# Patient Record
Sex: Female | Born: 1960 | Race: White | Hispanic: No | Marital: Married | Smoking: Never smoker
Health system: Southern US, Community
[De-identification: ages and names within clinical notes are randomized; demographics above are authoritative.]

## PROBLEM LIST (undated history)

## (undated) DIAGNOSIS — R112 Nausea with vomiting, unspecified: Secondary | ICD-10-CM

## (undated) DIAGNOSIS — Z9889 Other specified postprocedural states: Secondary | ICD-10-CM

## (undated) DIAGNOSIS — T7840XA Allergy, unspecified, initial encounter: Secondary | ICD-10-CM

## (undated) DIAGNOSIS — K219 Gastro-esophageal reflux disease without esophagitis: Secondary | ICD-10-CM

## (undated) HISTORY — DX: Allergy, unspecified, initial encounter: T78.40XA

## (undated) HISTORY — DX: Nausea with vomiting, unspecified: R11.2

## (undated) HISTORY — DX: Gastro-esophageal reflux disease without esophagitis: K21.9

## (undated) HISTORY — DX: Other specified postprocedural states: Z98.890

---

## 1997-06-25 ENCOUNTER — Other Ambulatory Visit: Admission: RE | Admit: 1997-06-25 | Discharge: 1997-06-25 | Payer: Self-pay | Admitting: Obstetrics and Gynecology

## 1997-08-08 ENCOUNTER — Inpatient Hospital Stay (HOSPITAL_COMMUNITY): Admission: AD | Admit: 1997-08-08 | Discharge: 1997-08-08 | Payer: Self-pay | Admitting: Obstetrics & Gynecology

## 1998-01-03 ENCOUNTER — Inpatient Hospital Stay (HOSPITAL_COMMUNITY): Admission: AD | Admit: 1998-01-03 | Discharge: 1998-01-03 | Payer: Self-pay | Admitting: Obstetrics and Gynecology

## 1998-02-13 ENCOUNTER — Inpatient Hospital Stay (HOSPITAL_COMMUNITY): Admission: AD | Admit: 1998-02-13 | Discharge: 1998-02-13 | Payer: Self-pay | Admitting: Obstetrics and Gynecology

## 1998-02-13 ENCOUNTER — Inpatient Hospital Stay (HOSPITAL_COMMUNITY): Admission: AD | Admit: 1998-02-13 | Discharge: 1998-02-16 | Payer: Self-pay | Admitting: Obstetrics and Gynecology

## 1998-05-22 ENCOUNTER — Other Ambulatory Visit: Admission: RE | Admit: 1998-05-22 | Discharge: 1998-05-22 | Payer: Self-pay | Admitting: Obstetrics and Gynecology

## 1999-01-22 ENCOUNTER — Ambulatory Visit (HOSPITAL_COMMUNITY): Admission: RE | Admit: 1999-01-22 | Discharge: 1999-01-22 | Payer: Self-pay | Admitting: Obstetrics and Gynecology

## 2002-04-30 ENCOUNTER — Other Ambulatory Visit: Admission: RE | Admit: 2002-04-30 | Discharge: 2002-04-30 | Payer: Self-pay | Admitting: Obstetrics and Gynecology

## 2004-04-03 ENCOUNTER — Ambulatory Visit: Payer: Self-pay | Admitting: Gastroenterology

## 2004-04-07 ENCOUNTER — Ambulatory Visit: Payer: Self-pay | Admitting: Gastroenterology

## 2004-09-09 ENCOUNTER — Ambulatory Visit: Payer: Self-pay | Admitting: Internal Medicine

## 2004-09-22 ENCOUNTER — Ambulatory Visit: Payer: Self-pay | Admitting: Internal Medicine

## 2004-10-07 ENCOUNTER — Ambulatory Visit: Payer: Self-pay | Admitting: Gastroenterology

## 2004-10-13 ENCOUNTER — Ambulatory Visit (HOSPITAL_COMMUNITY): Admission: RE | Admit: 2004-10-13 | Discharge: 2004-10-13 | Payer: Self-pay | Admitting: Gastroenterology

## 2004-10-22 ENCOUNTER — Ambulatory Visit (HOSPITAL_COMMUNITY): Admission: RE | Admit: 2004-10-22 | Discharge: 2004-10-22 | Payer: Self-pay | Admitting: Gastroenterology

## 2004-10-29 ENCOUNTER — Ambulatory Visit (HOSPITAL_COMMUNITY): Admission: RE | Admit: 2004-10-29 | Discharge: 2004-10-29 | Payer: Self-pay | Admitting: Gastroenterology

## 2004-10-29 ENCOUNTER — Ambulatory Visit: Payer: Self-pay | Admitting: Gastroenterology

## 2004-12-09 ENCOUNTER — Ambulatory Visit: Payer: Self-pay | Admitting: Gastroenterology

## 2004-12-22 ENCOUNTER — Ambulatory Visit (HOSPITAL_COMMUNITY): Admission: RE | Admit: 2004-12-22 | Discharge: 2004-12-22 | Payer: Self-pay | Admitting: Gastroenterology

## 2005-01-20 ENCOUNTER — Ambulatory Visit (HOSPITAL_COMMUNITY): Admission: RE | Admit: 2005-01-20 | Discharge: 2005-01-20 | Payer: Self-pay | Admitting: Surgery

## 2005-03-03 ENCOUNTER — Ambulatory Visit: Payer: Self-pay | Admitting: Gastroenterology

## 2009-04-02 ENCOUNTER — Ambulatory Visit: Payer: Self-pay | Admitting: Family Medicine

## 2009-04-11 ENCOUNTER — Ambulatory Visit: Payer: Self-pay | Admitting: Internal Medicine

## 2011-07-01 ENCOUNTER — Other Ambulatory Visit: Payer: Self-pay | Admitting: Obstetrics and Gynecology

## 2012-07-05 ENCOUNTER — Other Ambulatory Visit: Payer: Self-pay | Admitting: Obstetrics and Gynecology

## 2013-07-30 ENCOUNTER — Other Ambulatory Visit: Payer: Self-pay | Admitting: Obstetrics and Gynecology

## 2013-07-31 LAB — CYTOLOGY - PAP

## 2014-09-18 ENCOUNTER — Encounter: Payer: Self-pay | Admitting: Gastroenterology

## 2014-10-01 ENCOUNTER — Other Ambulatory Visit: Payer: Self-pay | Admitting: Obstetrics and Gynecology

## 2014-10-02 LAB — CYTOLOGY - PAP

## 2017-02-28 ENCOUNTER — Encounter: Payer: Self-pay | Admitting: Podiatry

## 2017-02-28 ENCOUNTER — Ambulatory Visit (INDEPENDENT_AMBULATORY_CARE_PROVIDER_SITE_OTHER): Payer: Managed Care, Other (non HMO) | Admitting: Podiatry

## 2017-02-28 DIAGNOSIS — L03031 Cellulitis of right toe: Secondary | ICD-10-CM | POA: Diagnosis not present

## 2017-02-28 DIAGNOSIS — L02611 Cutaneous abscess of right foot: Secondary | ICD-10-CM | POA: Diagnosis not present

## 2017-02-28 MED ORDER — GENTAMICIN SULFATE 0.1 % EX CREA: 1.0000 "application " | TOPICAL_CREAM | Freq: Three times a day (TID) | CUTANEOUS | 1 refills | Status: DC

## 2017-02-28 MED ORDER — DOXYCYCLINE HYCLATE 100 MG PO TABS: 100.0000 mg | ORAL_TABLET | Freq: Two times a day (BID) | ORAL | 0 refills | Status: DC

## 2017-03-03 LAB — WOUND CULTURE
MICRO NUMBER:: 90085513
RESULT:: NO GROWTH
SPECIMEN QUALITY:: ADEQUATE

## 2017-03-08 NOTE — Progress Notes (Signed)
   HPI: 57 year old female presents to the office today as a new patient regarding discoloration to her toenails.  She also states that her right third toe is red and swollen.  [place] was wearing sandals and she noticed some residual swelling and pain to the right third toe.  This is been approximately 2-3 weeks now.  She presents today for further treatment and evaluation  History reviewed. No pertinent past medical history.   Physical Exam: General: The patient is alert and oriented x3 in no acute distress.  Dermatology: Skin is warm, dry and supple bilateral lower extremities.  Superficial abscess lesion noted to the distal tuft of the third toe right foot.  Upon debridement there was some purulent drainage.  There is no exposed bone muscle tendon ligament or joint.  No malodor noted.  Post debridement measurements were approximately 0.6 x 0.6 x 0.1 cm.  There is some mild dystrophy noted to the nails 1-5 bilateral.  Nails are not necessarily thickened however there is some discoloration noted.  Likely not fungal related.  Vascular: Palpable pedal pulses bilaterally. No edema or erythema noted. Capillary refill within normal limits.  Neurological: Epicritic and protective threshold grossly intact bilaterally.   Musculoskeletal Exam: Range of motion within normal limits to all pedal and ankle joints bilateral. Muscle strength 5/5 in all groups bilateral.    Assessment: -Abscess third digit right foot distal tuft -Discoloration with nail dystrophy 1-5 bilateral   Plan of Care:  -Patient was evaluated today. -Incision and drainage with excisional debridement of the distal toe abscess was performed using a tissue nipper.  All purulent drainage was expressed and superficial necrotic tissue was excisionally debrided including subcutaneous tissue. -Wound cultures were taken and sent to pathology for culture and sensitivity -Prescription for doxycycline  100 mg -Prescription for gentamicin cream to be applied daily with a Band-Aid -Return to clinic in 2 weeks    Felecia ShellingBrent M. Brydan Downard, DPM [facility]  Dr. Felecia ShellingBrent M. Kaytee Taliercio, DPM    [address]                                        [place], [place] [address]                Office [phone]  Fax [phone]

## 2017-03-14 ENCOUNTER — Ambulatory Visit (INDEPENDENT_AMBULATORY_CARE_PROVIDER_SITE_OTHER): Payer: Managed Care, Other (non HMO) | Admitting: Podiatry

## 2017-03-14 DIAGNOSIS — L02611 Cutaneous abscess of right foot: Secondary | ICD-10-CM | POA: Diagnosis not present

## 2017-03-14 DIAGNOSIS — L03031 Cellulitis of right toe: Secondary | ICD-10-CM | POA: Diagnosis not present

## 2017-03-16 NOTE — Progress Notes (Signed)
   HPI: 57 year old female presenting today for follow up evaluation of an abscess present on the third toe of the right foot. She states the area is improving. She has been applying gentamicin cream as directed and has completed a course of Doxycycline. There are no modifying factors noted. Patient is here for further evaluation and treatment.   No past medical history on file.   Physical Exam: General: The patient is alert and oriented x3 in no acute distress.  Dermatology: Soft tissue lesion noted to distal tuft of the 3rd toe of the right foot. Negative for drainage. Skin is warm, dry and supple bilateral lower extremities.  Vascular: Palpable pedal pulses bilaterally. No edema or erythema noted. Capillary refill within normal limits.  Neurological: Epicritic and protective threshold grossly intact bilaterally.   Musculoskeletal Exam: Range of motion within normal limits to all pedal and ankle joints bilateral. Muscle strength 5/5 in all groups bilateral.   Assessment: - right 3rd toe abscess - improved   Plan of Care:  - Patient evaluated. - Light debridement performed with tissue nipper. Dry sterile dressing applied. - Continue using gentamicin cream as directed for one week. - Return to clinic in 4 weeks. If not better, we may consider cantharone.    Felecia ShellingBrent M. Katerine Morua, DPM [facility]  Dr. Felecia ShellingBrent M. Manette Doto, DPM    [address]                                        [place], [place] [address]                Office [phone]  Fax [phone]

## 2017-04-11 ENCOUNTER — Encounter: Payer: Managed Care, Other (non HMO) | Admitting: Podiatry

## 2017-04-18 NOTE — Progress Notes (Signed)
This encounter was created in error - please disregard.

## 2018-10-23 ENCOUNTER — Other Ambulatory Visit: Payer: Self-pay | Admitting: Obstetrics and Gynecology

## 2018-10-23 DIAGNOSIS — Z803 Family history of malignant neoplasm of breast: Secondary | ICD-10-CM

## 2018-11-15 ENCOUNTER — Ambulatory Visit
Admission: RE | Admit: 2018-11-15 | Discharge: 2018-11-15 | Disposition: A | Discharge status: Home / Self Care | Payer: Managed Care, Other (non HMO) | Source: Ambulatory Visit | Attending: Obstetrics and Gynecology | Admitting: Obstetrics and Gynecology

## 2018-11-15 DIAGNOSIS — Z803 Family history of malignant neoplasm of breast: Secondary | ICD-10-CM

## 2018-11-15 MED ORDER — GADOBUTROL 1 MMOL/ML IV SOLN
9.0000 mL | Freq: Once | INTRAVENOUS | Status: AC | PRN
  Administered 2018-11-15: 9 mL via INTRAVENOUS

## 2019-01-10 ENCOUNTER — Encounter: Payer: Self-pay | Admitting: Internal Medicine

## 2019-02-06 ENCOUNTER — Ambulatory Visit (AMBULATORY_SURGERY_CENTER): Payer: Managed Care, Other (non HMO) | Admitting: *Deleted

## 2019-02-06 ENCOUNTER — Other Ambulatory Visit: Payer: Self-pay

## 2019-02-06 VITALS — Temp 96.2°F | Ht 63.0 in | Wt 194.2 lb

## 2019-02-06 DIAGNOSIS — Z1211 Encounter for screening for malignant neoplasm of colon: Secondary | ICD-10-CM

## 2019-02-06 DIAGNOSIS — Z1159 Encounter for screening for other viral diseases: Secondary | ICD-10-CM

## 2019-02-06 NOTE — Progress Notes (Signed)
Pt is aware that care partner will wait in the car during procedure; if they feel like they will be too hot or cold to wait in the car; they may wait in the 4 th floor lobby. Patient is aware to bring only one care partner. We want them to wear a mask (we do not have any that we can provide them), practice social distancing, and we will check their temperatures when they get here.  I did remind the patient that their care partner needs to stay in the parking lot the entire time and have a cell phone available, we will call them when the pt is ready for discharge. Patient will wear mask into building.  covid test 02-15-19 at 820.  No egg or soy allergy  No home oxygen use or problems with anesthesia  No medications for weight loss taken  emmi information given

## 2019-02-13 ENCOUNTER — Encounter: Payer: Self-pay | Admitting: Internal Medicine

## 2019-02-15 ENCOUNTER — Ambulatory Visit (INDEPENDENT_AMBULATORY_CARE_PROVIDER_SITE_OTHER): Payer: Managed Care, Other (non HMO)

## 2019-02-15 ENCOUNTER — Other Ambulatory Visit: Payer: Self-pay | Admitting: Internal Medicine

## 2019-02-15 DIAGNOSIS — Z1159 Encounter for screening for other viral diseases: Secondary | ICD-10-CM

## 2019-02-16 LAB — SARS CORONAVIRUS 2 (TAT 6-24 HRS): SARS Coronavirus 2: NEGATIVE

## 2019-02-20 ENCOUNTER — Encounter: Payer: Self-pay | Admitting: Internal Medicine

## 2019-02-20 ENCOUNTER — Other Ambulatory Visit: Payer: Self-pay

## 2019-02-20 ENCOUNTER — Ambulatory Visit (AMBULATORY_SURGERY_CENTER): Payer: Managed Care, Other (non HMO) | Admitting: Internal Medicine

## 2019-02-20 VITALS — BP 176/71 | HR 57 | Temp 98.1°F | Resp 16 | Ht 63.0 in | Wt 194.2 lb

## 2019-02-20 DIAGNOSIS — Z1211 Encounter for screening for malignant neoplasm of colon: Secondary | ICD-10-CM | POA: Diagnosis present

## 2019-02-20 MED ORDER — SODIUM CHLORIDE 0.9 % IV SOLN: 500.0000 mL | Freq: Once | INTRAVENOUS | Status: DC

## 2019-02-20 NOTE — Op Note (Signed)
[facility] Patient Name: Jasmine Huffman Procedure Date: 02/20/2019 9:17 AM MRN: [id number] Endoscopist: Iva Boop , MD Age: 59 Referring MD:  Date of Birth: [birth year] Gender: Female Account #: [id number] Procedure:                Colonoscopy Indications:              Screening for colorectal malignant neoplasm Medicines:                Propofol per Anesthesia, Monitored Anesthesia Care Procedure:                Pre-Anesthesia Assessment:                           - Prior to the procedure, a History and Physical                            was performed, and patient medications and                            allergies were reviewed. The patient's tolerance of                            previous anesthesia was also reviewed. The risks                            and benefits of the procedure and the sedation                            options and risks were discussed with the patient.                            All questions were answered, and informed consent                            was obtained. Prior Anticoagulants: The patient has                            taken no previous anticoagulant or antiplatelet                            agents. ASA Grade Assessment: II - A patient with                            mild systemic disease. After reviewing the risks                            and benefits, the patient was deemed in                            satisfactory condition to undergo the procedure.                           After obtaining informed consent, the colonoscope  was passed under direct vision. Throughout the                            procedure, the patient's blood pressure, pulse, and                            oxygen saturations were monitored continuously. The                            Colonoscope was introduced through the anus and                            advanced to the the cecum, identified by   appendiceal orifice and ileocecal valve. The                            ileocecal valve, appendiceal orifice, and rectum                            were photographed. The quality of the bowel                            preparation was good. The bowel preparation used                            was Miralax via split dose instruction. Scope In: 9:29:04 AM Scope Out: 9:40:35 AM Scope Withdrawal Time: 0 hours 9 minutes 54 seconds  Total Procedure Duration: 0 hours 11 minutes 31 seconds  Findings:                 Skin tags were found on perianal exam.                           The digital rectal exam was normal.                           Multiple small-mouthed diverticula were found in                            the sigmoid colon.                           The exam was otherwise without abnormality on                            direct and retroflexion views. Complications:            No immediate complications. Estimated blood loss:                            None. Estimated Blood Loss:     Estimated blood loss: none. Impression:               - Perianal skin tags found on perianal exam.                           -  Diverticulosis in the sigmoid colon.                           - The examination was otherwise normal on direct                            and retroflexion views.                           - No specimens collected. Recommendation:           - Repeat colonoscopy/other appropriate test in 10                            years for screening purposes. Does not need annual                            hemoccults in the interim.                           - Resume previous diet.                           - Continue present medications. Gatha Mayer, MD 02/20/2019 9:50:23 AM This report has been signed electronically.

## 2019-02-20 NOTE — Patient Instructions (Addendum)
No polyps or cancer seen, clean prep.  You do have diverticulosis - thickened muscle rings and pouches in the colon wall. Please read the handout about this condition.  Next routine colonoscopy or other screening test in 10 years - 2031  Begin taking Prilosec daily.  Call to make appointment to discuss GERD symptoms.  GERD handout provided.  I appreciate the opportunity to care for you. Iva Boop, MD, FACG   YOU HAD AN ENDOSCOPIC PROCEDURE TODAY AT THE [facility]:   Refer to the procedure report that was given to you for any specific questions about what was found during the examination.  If the procedure report does not answer your questions, please call your gastroenterologist to clarify.  If you requested that your care partner not be given the details of your procedure findings, then the procedure report has been included in a sealed envelope for you to review at your convenience later.  YOU SHOULD EXPECT: Some feelings of bloating in the abdomen. Passage of more gas than usual.  Walking can help get rid of the air that was put into your GI tract during the procedure and reduce the bloating. If you had a lower endoscopy (such as a colonoscopy or flexible sigmoidoscopy) you may notice spotting of blood in your stool or on the toilet paper. If you underwent a bowel prep for your procedure, you may not have a normal bowel movement for a few days.  Please Note:  You might notice some irritation and congestion in your nose or some drainage.  This is from the oxygen used during your procedure.  There is no need for concern and it should clear up in a day or so.  SYMPTOMS TO REPORT IMMEDIATELY:   Following lower endoscopy (colonoscopy or flexible sigmoidoscopy):  Excessive amounts of blood in the stool  Significant tenderness or worsening of abdominal pains  Swelling of the abdomen that is new, acute  Fever of 100F or higher   For urgent or emergent issues, a  gastroenterologist can be reached at any hour by calling [phone].   DIET:  We do recommend a small meal at first, but then you may proceed to your regular diet.  Drink plenty of fluids but you should avoid alcoholic beverages for 24 hours.  ACTIVITY:  You should plan to take it easy for the rest of today and you should NOT DRIVE or use heavy machinery until tomorrow (because of the sedation medicines used during the test).    FOLLOW UP: Our staff will call the number listed on your records 48-72 hours following your procedure to check on you and address any questions or concerns that you may have regarding the information given to you following your procedure. If we do not reach you, we will leave a message.  We will attempt to reach you two times.  During this call, we will ask if you have developed any symptoms of COVID 19. If you develop any symptoms (ie: fever, flu-like symptoms, shortness of breath, cough etc.) before then, please call [phone].  If you test positive for Covid 19 in the 2 weeks post procedure, please call and report this information to [place].    If any biopsies were taken you will be contacted by phone or by letter within the next 1-3 weeks.  Please call us at [phone] if you have not heard about the biopsies in 3 weeks.    SIGNATURES/CONFIDENTIALITY: You and/or your care partner have signed  paperwork which will be entered into your electronic medical record.  These signatures attest to the fact that that the information above on your After Visit Summary has been reviewed and is understood.  Full responsibility of the confidentiality of this discharge information lies with you and/or your care-partner.

## 2019-02-20 NOTE — Progress Notes (Signed)
JB - Temp  DT - VS   

## 2019-02-20 NOTE — Progress Notes (Signed)
Report to PACU, RN, vss, BBS= Clear.  

## 2019-02-22 ENCOUNTER — Telehealth: Payer: Self-pay

## 2019-02-22 NOTE — Telephone Encounter (Signed)
Left message on follow up call. 

## 2019-02-22 NOTE — Telephone Encounter (Signed)
Attempted to reach patient for post-procedure f/u call. No answer. Left message that we will make another attempt to reach her again later today and for her to please not hesitate to call us if she has any questions/concerns. 

## 2019-04-14 ENCOUNTER — Ambulatory Visit: Payer: Managed Care, Other (non HMO) | Attending: Internal Medicine

## 2019-04-14 DIAGNOSIS — Z23 Encounter for immunization: Secondary | ICD-10-CM | POA: Insufficient documentation

## 2019-04-14 NOTE — Progress Notes (Signed)
   Covid-19 Vaccination Clinic  Name:  Jasmine Huffman    MRN: [id number] DOB: 12-21-1960  04/14/2019  Jasmine Huffman was observed post Covid-19 immunization for 15 minutes without incident. She was provided with Vaccine Information Sheet and instruction to access the V-Safe system.   Jasmine Huffman was instructed to call 911 with any severe reactions post vaccine: Marland Kitchen Difficulty breathing  . Swelling of face and throat  . A fast heartbeat  . A bad rash all over body  . Dizziness and weakness   Immunizations Administered    Name Date Dose VIS Date Route   Pfizer COVID-19 Vaccine 04/14/2019  6:19 PM 0.3 mL 01/19/2019 Intramuscular   Manufacturer: [employer], [employer]   Lot: [id number]   NDC: [address]-5

## 2019-04-17 ENCOUNTER — Ambulatory Visit: Payer: Managed Care, Other (non HMO)

## 2019-05-05 ENCOUNTER — Ambulatory Visit: Payer: Managed Care, Other (non HMO) | Attending: Internal Medicine

## 2019-05-05 DIAGNOSIS — Z23 Encounter for immunization: Secondary | ICD-10-CM

## 2019-05-05 NOTE — Progress Notes (Signed)
   Covid-19 Vaccination Clinic  Name:  Jasmine Huffman    MRN: [id number] DOB: 1960-05-23  05/05/2019  Jasmine Huffman was observed post Covid-19 immunization for 15 minutes without incident. She was provided with Vaccine Information Sheet and instruction to access the V-Safe system.   Jasmine Huffman was instructed to call 911 with any severe reactions post vaccine: Marland Kitchen Difficulty breathing  . Swelling of face and throat  . A fast heartbeat  . A bad rash all over body  . Dizziness and weakness   Immunizations Administered    Name Date Dose VIS Date Route   Pfizer COVID-19 Vaccine 05/05/2019  9:43 AM 0.3 mL 01/19/2019 Intramuscular   Manufacturer: [employer], [employer]   Lot: [id number]   NDC: [address]-7

## 2019-11-08 IMAGING — MR MR BREAST BILAT WO/W CM
8 of 12 series · 33 of 48 positions shown · IV contrast (9 ml gadavist)
Comparison: Previous exam(s).

CLINICAL DATA: High risk screening. Patient's mother diagnosed with
breast carcinoma at age 50. Calculated lifetime risk for breast
carcinoma, 29%.

LABS:  No labs drawn at time of imaging.
EXAM:
BILATERAL BREAST MRI WITH AND WITHOUT CONTRAST
TECHNIQUE: Multiplanar, multisequence MR images of both breasts were obtained
prior to and following the intravenous administration of 9 ml of
Gadavist

[Series 2: t2_tirm_tra ipat (a-p) · axial · 3.0mm · 0.70mm/px · 1 of 62 slices shown]
[im 1/62]
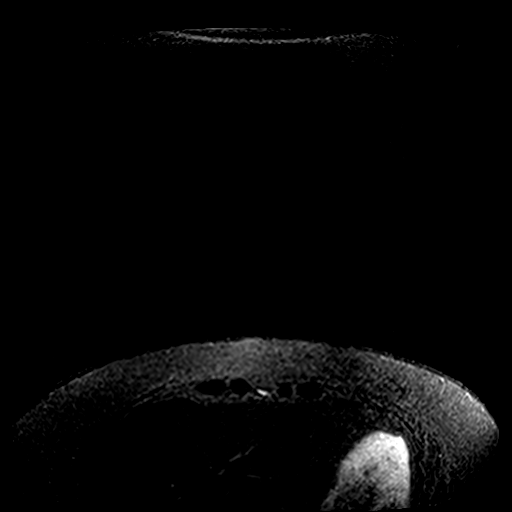

[Series 3: fl3d pre-cm no · axial · non-contrast · 1.2mm · 0.94mm/px · z∈[-72,+118]mm · 5 of 160 slices shown]
[im 1/160]
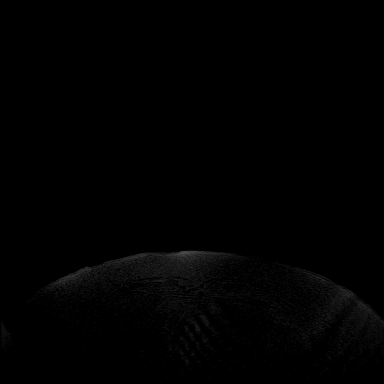
[im 40/160]
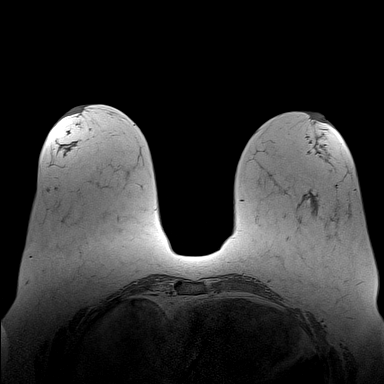
[im 80/160]
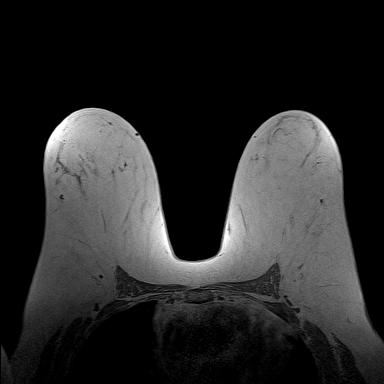
[im 120/160]
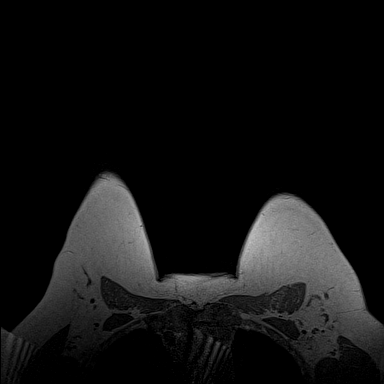
[im 160/160]
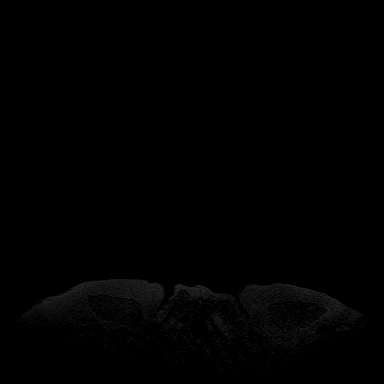

[Series 4: fl3d pre-cm · axial · non-contrast · 1.2mm · 0.94mm/px · z∈[-72,+118]mm · 5 of 160 slices shown]
[im 1/160]
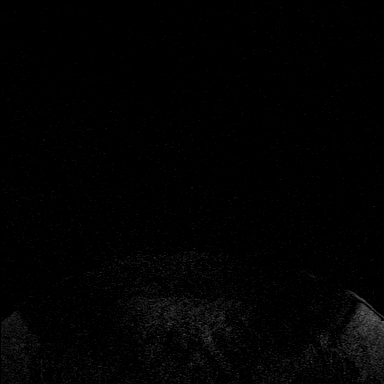
[im 40/160]
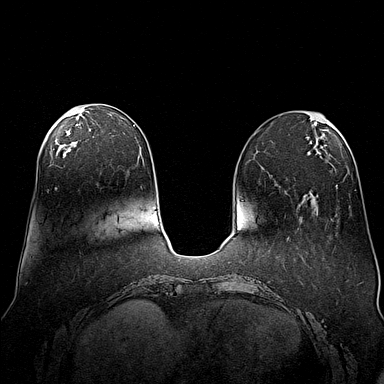
[im 80/160]
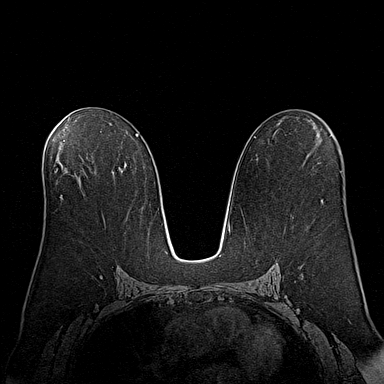
[im 120/160]
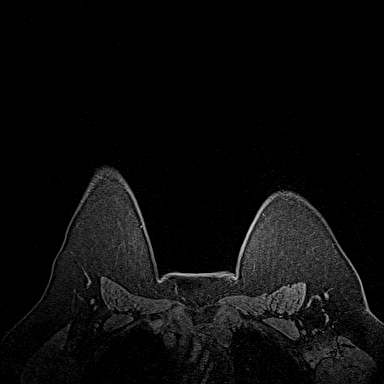
[im 160/160]
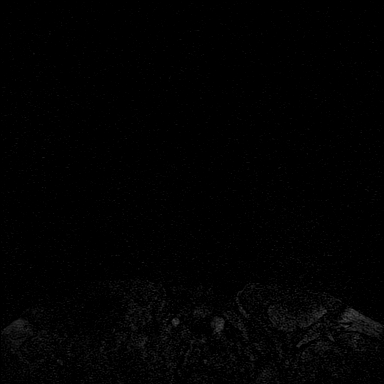

[Series 5: fl3d post-cm 20 · axial · 1.2mm · 0.94mm/px · z∈[-72,+118]mm · 5 of 160 slices shown (1 of 3)]
[im 1/160]
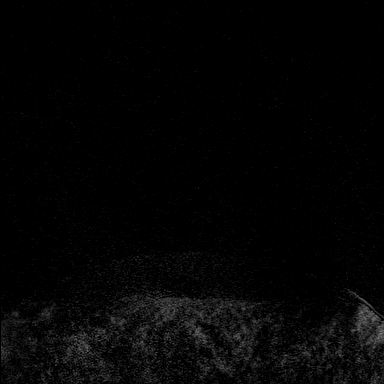
[im 40/160]
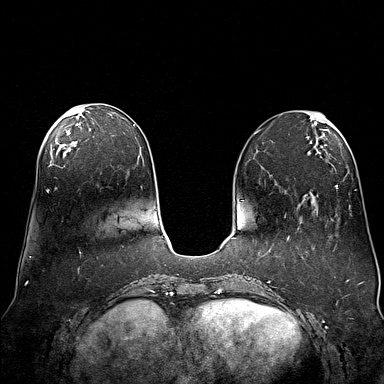
[im 80/160]
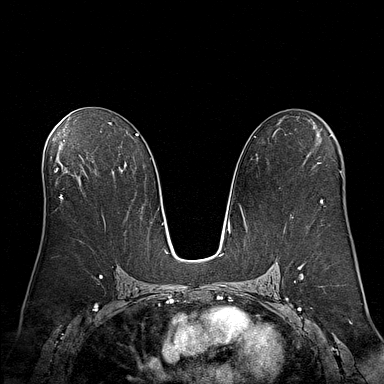
[im 120/160]
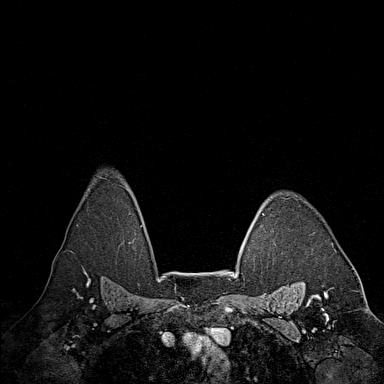
[im 160/160]
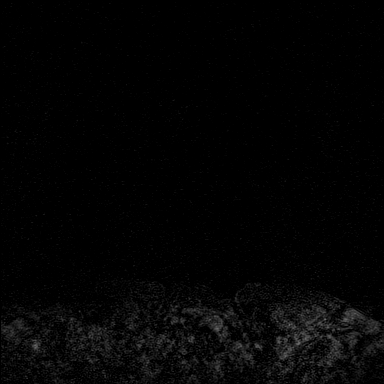

[Series 6: fl3d post-cm 20 · axial · 1.2mm · 0.94mm/px · z∈[-72,+118]mm · 5 of 160 slices shown (2 of 3)]
[im 1/160]
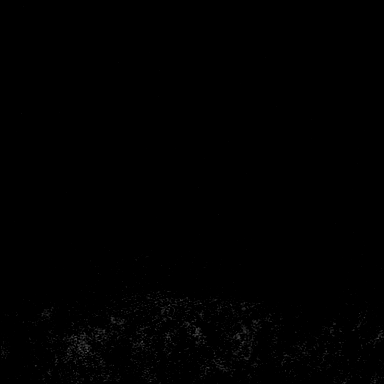
[im 40/160]
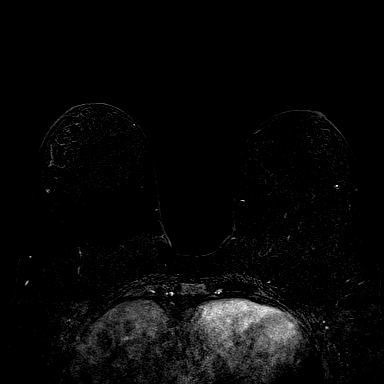
[im 80/160]
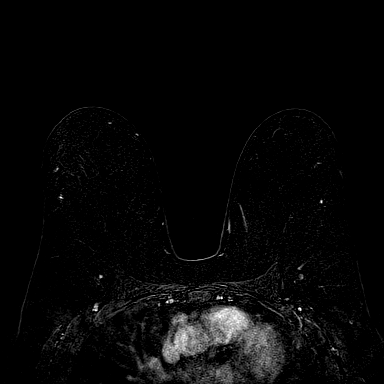
[im 120/160]
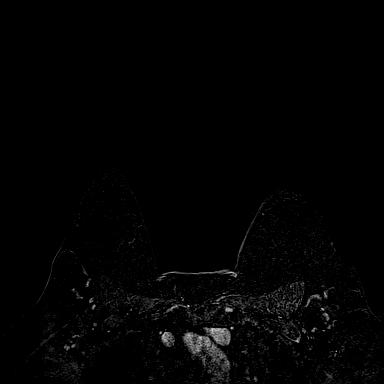
[im 160/160]
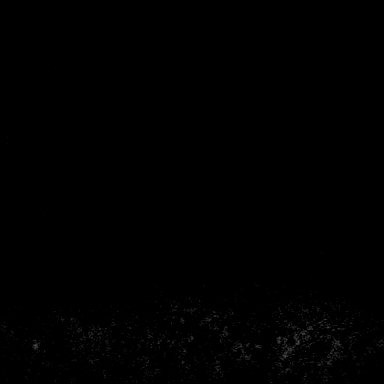

[Series 7: fl3d post-cm 20 · axial · 192.0mm · 0.94mm/px · 1 of 1 slices shown (3 of 3)]
[im 1/1]
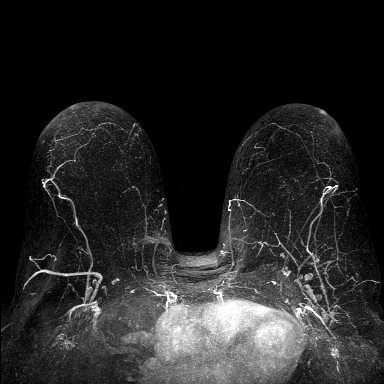

[Series 8: fl3d post-cm 3min · axial · 1.2mm · 0.94mm/px · z∈[-72,+118]mm · 6 of 160 slices shown]
[im 1/160]
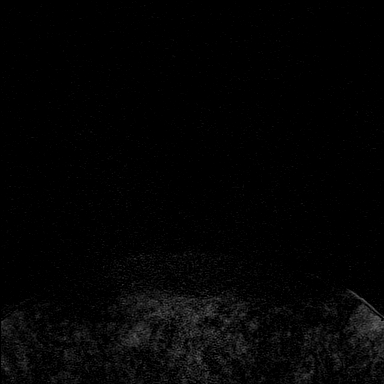
[im 32/160]
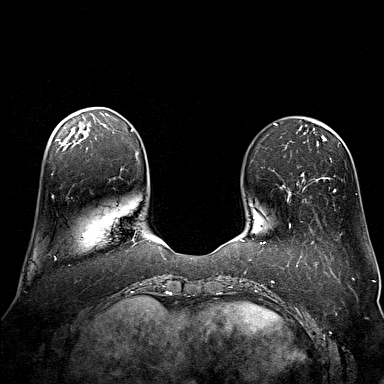
[im 64/160]
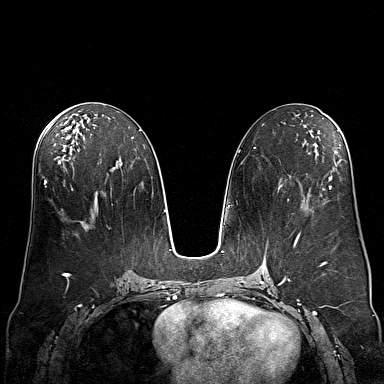
[im 96/160]
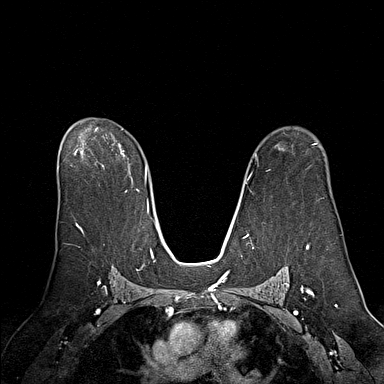
[im 128/160]
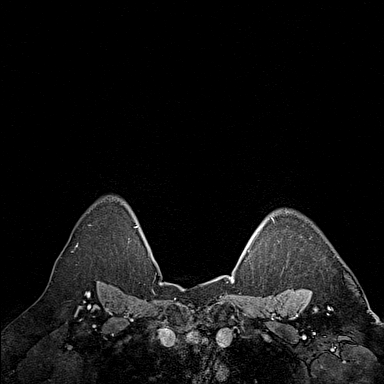
[im 160/160]
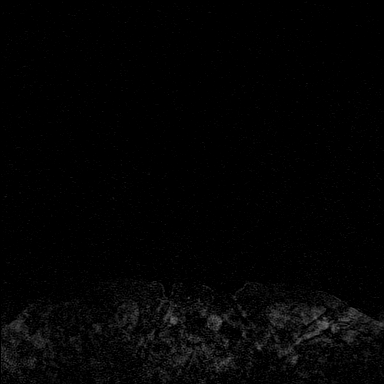

[Series 9: fl3d post-cm 3min_sub · axial · 1.2mm · 0.94mm/px · z∈[-72,+80]mm · 5 of 160 slices shown]
[im 1/160]
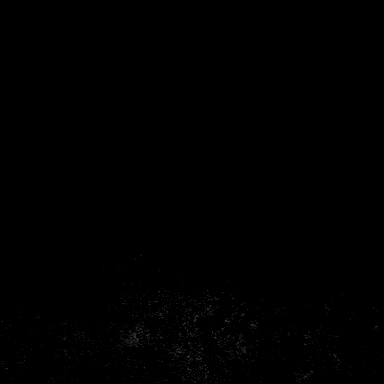
[im 32/160]
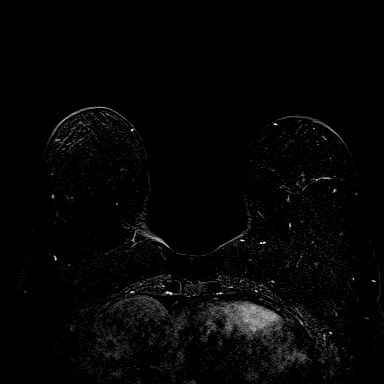
[im 64/160]
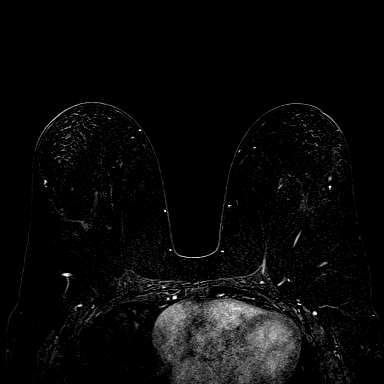
[im 96/160]
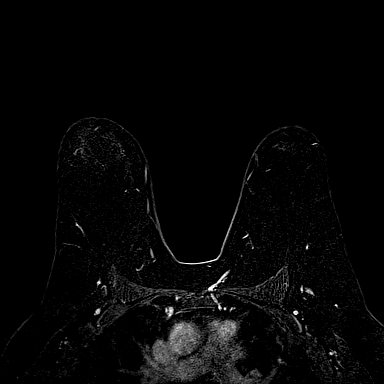
[im 128/160]
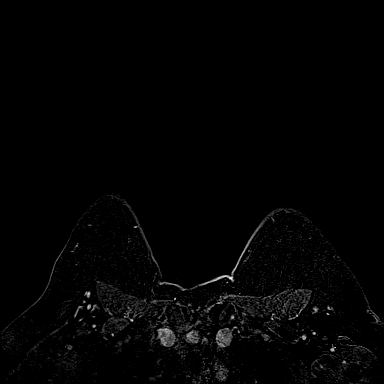

[33 of 48 positions shown; findings below may reference images not displayed]

Three-dimensional MR images were rendered by post-processing of the
original MR data on an independent workstation. The
three-dimensional MR images were interpreted, and findings are
reported in the following complete MRI report for this study. Three
dimensional images were evaluated at the independent [employer]
workstation
FINDINGS: Breast composition: b. Scattered fibroglandular tissue.

Background parenchymal enhancement: Minimal.

Right breast: No mass or abnormal enhancement.

Left breast: No mass or abnormal enhancement.

Lymph nodes: No abnormal appearing lymph nodes.

Ancillary findings:  None.
IMPRESSION: 1. Negative exam.  No evidence of breast malignancy.

RECOMMENDATION:
1. Screening mammogram in one year.(Code:54-H-NF1)
2. Annual breast MRI recommended given the patient's elevated
lifetime risk developing breast carcinoma.

BI-RADS CATEGORY  1: Negative.
